# Patient Record
Sex: Male | Born: 1995 | Race: White | Hispanic: No | Marital: Single | Smoking: Never smoker
Health system: Southern US, Community
[De-identification: ages and names within clinical notes are randomized; demographics above are authoritative.]

## PROBLEM LIST (undated history)

## (undated) DIAGNOSIS — I10 Essential (primary) hypertension: Secondary | ICD-10-CM

## (undated) HISTORY — PX: OTHER SURGICAL HISTORY: SHX169

## (undated) HISTORY — DX: Essential (primary) hypertension: I10

---
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
## 2016-09-03 DIAGNOSIS — H5213 Myopia, bilateral: Secondary | ICD-10-CM | POA: Diagnosis not present

## 2016-12-09 IMAGING — MR MR MRA CHEST W/ OR W/O CM
13 series · 16 of 16 positions shown · IV contrast (multihance)
Comparison: none

CLINICAL DATA: Cardiomyopathy Murmur/Bruit

EXAM:
CARDIAC MRI
TECHNIQUE: The patient was scanned on a 1.5 Tesla GE magnet. A dedicated
cardiac coil was used. Functional imaging was done using Fiesta
sequences. [DATE], and 4 chamber views were done to assess for RWMA's.
Modified Paei rule using a short axis stack was used to
calculate an ejection fraction on a dedicated work station using
Circle software. The patient received 25 cc of Multihance. After 10
minutes inversion recovery sequences were used to assess for
infiltration and scar tissue.
CONTRAST:  25 cc Multihance

[Series 3: bSSFP · sagittal · 8.0mm · 1.25mm/px · 1 of 16 slices shown (1 of 7)]
[im 1/16]
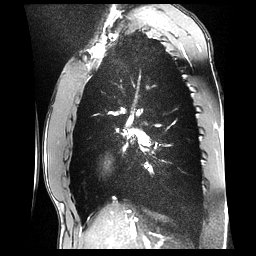

[Series 4: bSSFP · oblique · 8.0mm · 1.37mm/px · 1 of 120 slices shown (2 of 7)]
[im 1/120]
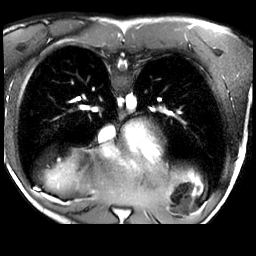

[Series 5: bSSFP · oblique · 8.0mm · 1.37mm/px · 2 of 300 slices shown (3 of 7)]
[im 1/300]
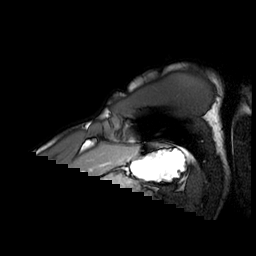
[im 300/300]
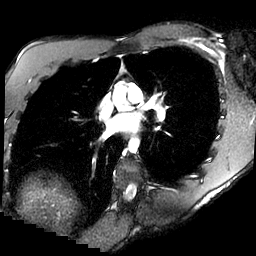

[Series 8: T1 · axial · 6.0mm · 1.09mm/px · 1 of 4 slices shown]
[im 1/4]
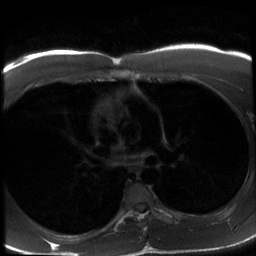

[Series 11: bSSFP · coronal · 6.0mm · 1.37mm/px · 1 of 220 slices shown (4 of 7)]
[im 1/220]
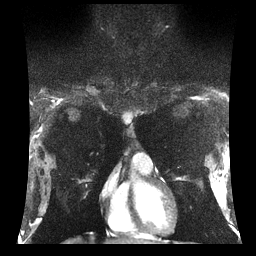

[Series 12: bSSFP · coronal · 6.0mm · 1.37mm/px · 1 of 13 slices shown (5 of 7)]
[im 1/13]
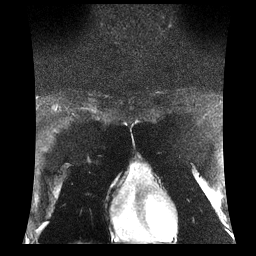

[Series 13: bSSFP · sagittal · 6.0mm · 1.25mm/px · 3 of 260 slices shown (6 of 7)]
[im 1/260]
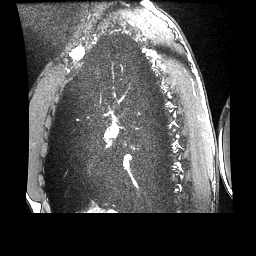
[im 130/260]
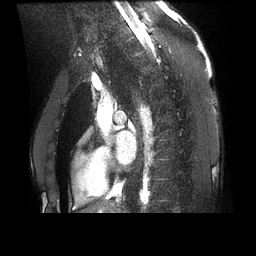
[im 260/260]
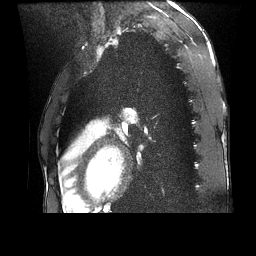

[Series 15: bSSFP · oblique · 8.0mm · 1.37mm/px · 1 of 60 slices shown (7 of 7)]
[im 1/60]
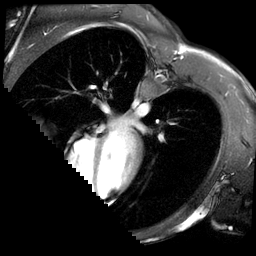

[Series 19: 3d mde · oblique · 8.0mm · 1.41mm/px · 1 of 28 slices shown]
[im 1/28]
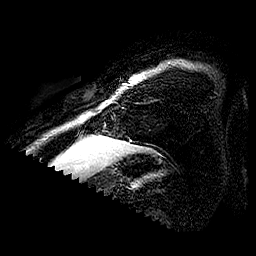

[Series 21: delayed ir prep · oblique · 8.0mm · 1.37mm/px · 1 of 12 slices shown]
[im 1/12]
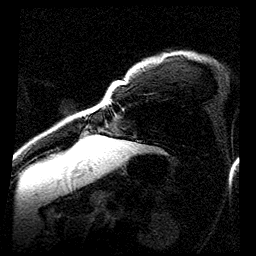

[Series 23: rad delayed ir · oblique · 8.0mm · 1.37mm/px · 1 of 3 slices shown]
[im 1/3]
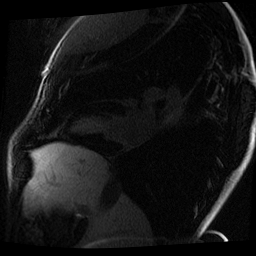

[((id)/(id)/1)-((id)/(id)/1) · sagittal · 2.6mm · 0.66mm/px · 1 of 68 slices shown (1 of 2)]
[im 1/68]
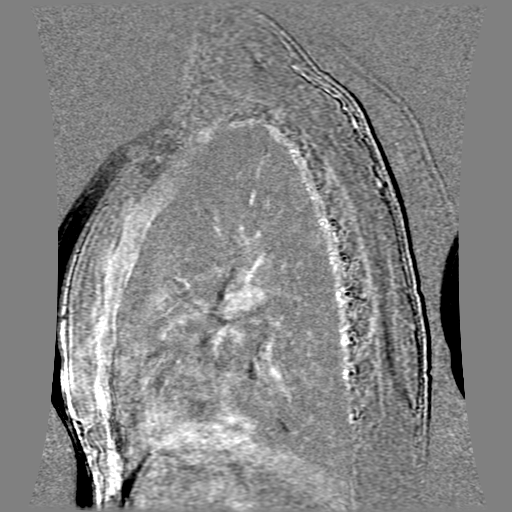

[((id)/(id)/1)-((id)/(id)/1) · sagittal · 2.6mm · 0.66mm/px · 1 of 68 slices shown (2 of 2)]
[im 1/68]
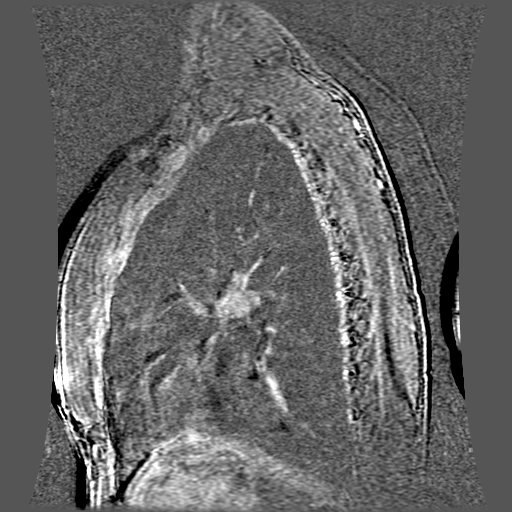

[16 of 16 positions shown; findings below may reference images not displayed]

FINDINGS: The LV was mildly dilatated. The LA/RA and RV were normal in size
and function. The aortic mitral and tricuspid valves were normal.
There was no ASD/VSD. There was no pericardial effusion. The
pulmonary veins drained normally into the LA. The aortic root was
normal. The quantitative EF was low normal at 51% with no discrete
RWMA;s. (EDV 127 cc, ESV 62 cc SV 65 cc) Delayed enhancement images
with normal with no infiltration or scar.

Aortic MRA: The aortic valve was trileaflet and normal. The aortic
root was normal. The great vessels arose normally from the arch.
There was no coarctation although the arch and descending thoracic
aorta were somewhat diminutive in size:

Ascending aortic Root:  2.6 cm

Aortic Arch:  1.6 cm

Descending Thoracic aorta 1.4 cm

No aneurysm, dissection, or anomaly
IMPRESSION: 1) Mild LVE with low normal EF 51% no discrete RWMA

2) No delayed hyperenhancement post gadolinium

3) Normal aortic root no coarctation

4) Normal origin of the great vessels from the arch

5) Somewhat diminutive aortic arch and descending thoracic aorta
measuring 1.6 and 1.4 cm respectively

Jerusalimskimetoh Habbey

## 2017-08-26 DIAGNOSIS — H5213 Myopia, bilateral: Secondary | ICD-10-CM | POA: Diagnosis not present

## 2018-03-02 DIAGNOSIS — R509 Fever, unspecified: Secondary | ICD-10-CM | POA: Diagnosis not present

## 2018-07-07 DIAGNOSIS — J029 Acute pharyngitis, unspecified: Secondary | ICD-10-CM | POA: Diagnosis not present

## 2018-09-08 DIAGNOSIS — H5213 Myopia, bilateral: Secondary | ICD-10-CM | POA: Diagnosis not present

## 2019-01-14 ENCOUNTER — Encounter (HOSPITAL_COMMUNITY): Payer: Self-pay | Admitting: Emergency Medicine

## 2019-01-14 ENCOUNTER — Emergency Department (HOSPITAL_COMMUNITY)
Admission: EM | Admit: 2019-01-14 | Discharge: 2019-01-14 | Disposition: A | Discharge status: Home / Self Care | Payer: BLUE CROSS/BLUE SHIELD | Attending: Emergency Medicine | Admitting: Emergency Medicine

## 2019-01-14 ENCOUNTER — Other Ambulatory Visit: Payer: Self-pay

## 2019-01-14 DIAGNOSIS — I10 Essential (primary) hypertension: Secondary | ICD-10-CM | POA: Insufficient documentation

## 2019-01-14 DIAGNOSIS — Y929 Unspecified place or not applicable: Secondary | ICD-10-CM | POA: Insufficient documentation

## 2019-01-14 DIAGNOSIS — Y9389 Activity, other specified: Secondary | ICD-10-CM | POA: Insufficient documentation

## 2019-01-14 DIAGNOSIS — Z91040 Latex allergy status: Secondary | ICD-10-CM | POA: Insufficient documentation

## 2019-01-14 DIAGNOSIS — X010XXA Exposure to flames in uncontrolled fire, not in building or structure, initial encounter: Secondary | ICD-10-CM | POA: Diagnosis not present

## 2019-01-14 DIAGNOSIS — T22132A Burn of first degree of left upper arm, initial encounter: Secondary | ICD-10-CM | POA: Insufficient documentation

## 2019-01-14 DIAGNOSIS — T2020XA Burn of second degree of head, face, and neck, unspecified site, initial encounter: Secondary | ICD-10-CM | POA: Diagnosis not present

## 2019-01-14 MED ORDER — BACITRACIN ZINC 500 UNIT/GM EX OINT: 1.0000 "application " | TOPICAL_OINTMENT | Freq: Two times a day (BID) | CUTANEOUS | 0 refills | Status: AC

## 2019-01-14 MED ORDER — TETRACAINE HCL 0.5 % OP SOLN
1.0000 [drp] | Freq: Once | OPHTHALMIC | Status: AC
  Administered 2019-01-14: 1 [drp] via OPHTHALMIC
  Filled 2019-01-14: qty 4

## 2019-01-14 MED ORDER — BACITRACIN ZINC 500 UNIT/GM EX OINT
1.0000 "application " | TOPICAL_OINTMENT | Freq: Two times a day (BID) | CUTANEOUS | Status: DC
  Administered 2019-01-14: 1 via TOPICAL
  Filled 2019-01-14: qty 1.8

## 2019-01-14 MED ORDER — FLUORESCEIN SODIUM 1 MG OP STRP
1.0000 | ORAL_STRIP | Freq: Once | OPHTHALMIC | Status: AC
  Administered 2019-01-14: 14:00:00 1 via OPHTHALMIC
  Filled 2019-01-14: qty 1
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
## 2019-08-27 DIAGNOSIS — H5203 Hypermetropia, bilateral: Secondary | ICD-10-CM | POA: Diagnosis not present

## 2019-08-27 DIAGNOSIS — H01002 Unspecified blepharitis right lower eyelid: Secondary | ICD-10-CM | POA: Diagnosis not present
